# Patient Record
Sex: Female | Born: 1980 | Hispanic: No | Marital: Married | Smoking: Former smoker
Health system: Southern US, Community
[De-identification: ages and names within clinical notes are randomized; demographics above are authoritative.]

## PROBLEM LIST (undated history)

## (undated) DIAGNOSIS — O24419 Gestational diabetes mellitus in pregnancy, unspecified control: Secondary | ICD-10-CM

## (undated) HISTORY — DX: Gestational diabetes mellitus in pregnancy, unspecified control: O24.419

---

## 2001-07-09 ENCOUNTER — Other Ambulatory Visit: Admission: RE | Admit: 2001-07-09 | Discharge: 2001-07-09 | Payer: Self-pay | Admitting: Obstetrics and Gynecology

## 2003-09-23 ENCOUNTER — Other Ambulatory Visit: Admission: RE | Admit: 2003-09-23 | Discharge: 2003-09-23 | Payer: Self-pay | Admitting: Gynecology

## 2005-01-16 ENCOUNTER — Other Ambulatory Visit: Admission: RE | Admit: 2005-01-16 | Discharge: 2005-01-16 | Payer: Self-pay | Admitting: Obstetrics and Gynecology

## 2005-05-15 ENCOUNTER — Inpatient Hospital Stay (HOSPITAL_COMMUNITY): Admission: AD | Admit: 2005-05-15 | Discharge: 2005-05-15 | Payer: Self-pay | Admitting: Obstetrics and Gynecology

## 2005-05-16 ENCOUNTER — Inpatient Hospital Stay (HOSPITAL_COMMUNITY): Admission: AD | Admit: 2005-05-16 | Discharge: 2005-05-16 | Payer: Self-pay | Admitting: Obstetrics and Gynecology

## 2005-05-30 ENCOUNTER — Encounter: Admission: RE | Admit: 2005-05-30 | Discharge: 2005-05-30 | Payer: Self-pay | Admitting: Obstetrics and Gynecology

## 2005-07-25 ENCOUNTER — Inpatient Hospital Stay (HOSPITAL_COMMUNITY): Admission: AD | Admit: 2005-07-25 | Discharge: 2005-07-27 | Payer: Self-pay | Admitting: Obstetrics and Gynecology

## 2008-08-03 ENCOUNTER — Inpatient Hospital Stay (HOSPITAL_COMMUNITY): Admission: AD | Admit: 2008-08-03 | Discharge: 2008-08-04 | Payer: Self-pay | Admitting: Obstetrics and Gynecology

## 2011-04-07 NOTE — Discharge Summary (Signed)
NAMEPRISCILLIA, Kathleen Walton               ACCOUNT NO.:  [id number]   MEDICAL RECORD NO.:  [id number]          PATIENT TYPE:  INP   LOCATION:  9112                          FACILITY:  [facility]   PHYSICIAN:  Zenaida Niece, M.D.DATE OF BIRTH:  07/08/1981   DATE OF ADMISSION:  07/25/2005  DATE OF DISCHARGE:  07/27/2005                                 DISCHARGE SUMMARY   ADMISSION DIAGNOSES:  Intra-uterine pregnancy at 38 weeks and class A1  gestational diabetes.   DISCHARGE DIAGNOSES:  Intra-uterine pregnancy at 38 weeks and class A1  gestational diabetes.   PROCEDURES:  On July 25, 2005, she had a spontaneous vaginal delivery.   HISTORY AND PHYSICAL:  This is a 30 year old white female gravida 2, para 0-  0-1-0 with an EGA of 38+ weeks who presents complaining of regular  contractions. The cervix on initial evaluation revealed her to be 9 cm  dilated with an intact bag of water. Prenatal care complicated by class A1  gestational diabetes with good control.   PRENATAL LABORATORIES:  A blood type is A+ with a negative antibody screen.  RPR nonreactive, rubella immune, hepatitis B surface antigen negative, HIV  negative, gonorrhea and chlamydia negative, one hour Glucola 162, three hour  GTT 91, 211, 220 and 168 and group B strep is negative.   PAST OBSTETRIC HISTORY:  Significant for one elective termination.   PHYSICAL EXAMINATION:  VITAL SIGNS:  She is afebrile with stable vital  signs. Fetal heart tracing reactive with contractions every two to three  minutes.  ABDOMEN:  Gravid, nontender with an estimated fetal weight of 8 pounds.  Cervix on my first exam, she was complete, complete and +3 with a vertex  presentation and an adequate pelvis and had just had spontaneous rupture of  membranes with clear fluid.   HOSPITAL COURSE:  The patient was admitted in advanced labor. She progressed  to complete, pushed well and on the evening of September5, 2006, had a  vaginal delivery of a  viable female infant with Apgars of 9 and 9, that  weighed 8 pounds 11 ounces. Placenta delivered spontaneously and was intact.  She had a second-degree laceration repaired with 3-0 Vicryl with local  block. Estimated blood loss was less than 500 cc. She was given IM Pitocin  and did not receive an IV due to her rapid progress. Postpartum, she had no  significant complications with a hemoglobin of 10.8 and on postpartum #2,  was stable for discharge home.   DISCHARGE INSTRUCTIONS:  Regular diet, pelvic rest, follow-up in six weeks.  Medications are over-the-counter ibuprofen as needed. She is given our  discharge pamphlet.      Zenaida Niece, M.D.  Electronically Signed    TDM/MEDQ  D:  07/27/2005  T:  07/27/2005  Job:  [id number]

## 2011-08-21 LAB — CBC
HCT: 34.5 — ABNORMAL LOW
MCV: 85
Platelets: 115 — ABNORMAL LOW
RBC: 4.06
WBC: 12 — ABNORMAL HIGH

## 2011-08-23 LAB — CBC
HCT: 38.1
Hemoglobin: 12.7
MCV: 86.2
Platelets: 130 — ABNORMAL LOW
WBC: 11.6 — ABNORMAL HIGH

## 2012-02-11 ENCOUNTER — Ambulatory Visit (INDEPENDENT_AMBULATORY_CARE_PROVIDER_SITE_OTHER): Payer: 59 | Admitting: Physician Assistant

## 2012-02-11 VITALS — BP 102/67 | HR 62 | Temp 98.5°F | Resp 16 | Ht 61.0 in | Wt 131.0 lb

## 2012-02-11 DIAGNOSIS — R509 Fever, unspecified: Secondary | ICD-10-CM

## 2012-02-11 DIAGNOSIS — R059 Cough, unspecified: Secondary | ICD-10-CM

## 2012-02-11 DIAGNOSIS — B9789 Other viral agents as the cause of diseases classified elsewhere: Secondary | ICD-10-CM

## 2012-02-11 DIAGNOSIS — R05 Cough: Secondary | ICD-10-CM

## 2012-02-11 MED ORDER — IPRATROPIUM BROMIDE 0.06 % NA SOLN: 2.0000 | Freq: Four times a day (QID) | NASAL | Status: AC

## 2012-02-11 MED ORDER — PROMETHAZINE-DM 6.25-15 MG/5ML PO SYRP: 5.0000 mL | ORAL_SOLUTION | Freq: Every day | ORAL | Status: AC

## 2012-02-11 NOTE — Progress Notes (Signed)
  Subjective:    Patient ID: Kathleen Walton, female    DOB: 06-23-1981, 31 y.o.   MRN: [id number]  HPI  Kathleen Walton is here today with > 2 days of fever/chills (T max 102), myalgias, dry cough. Some nasal congestion.  No SOB or wheezing. No GI symptoms. No influenza vaccine this season.  Smoker   Review of Systems As above    Objective:   Physical Exam  Constitutional: Vital signs are normal.  HENT:  Right Ear: Tympanic membrane normal.  Left Ear: Tympanic membrane normal.  Nose: Mucosal edema present.  Mouth/Throat: Uvula is midline, oropharynx is clear and moist and mucous membranes are normal.  Cardiovascular: Normal rate and regular rhythm.   Pulmonary/Chest: Effort normal and breath sounds normal.  Lymphadenopathy:    She has no cervical adenopathy.  Skin: Skin is warm and dry.          Assessment & Plan:  Fever  Cough, likely influenza but > 48 hours from onset of symptoms  Phenergan DM, Mucinex DM for daytime cough, Atrovent NS.  Tylenol every 4 hours. Watch for worsening symptoms.

## 2013-11-20 NOTE — L&D Delivery Note (Signed)
Delivery Note Pt progressed rapidly to complete and pushed once.  I was 40 feet away and the baby was in the bed by the time I got to the room.  At 10:26 PM a viable female was delivered via Vaginal, Spontaneous Delivery (Presentation: Left Occiput Anterior).  APGAR: 9, 9; weight  pending.   Placenta status: Intact, Spontaneous.  Cord: 3 vessels with the following complications: None.   Anesthesia: Local  Episiotomy: None Lacerations: 1st degree, right labial Suture Repair: 3.0 vicryl rapide Est. Blood Loss (mL): 300  Mom to postpartum.  Baby to Couplet care / Skin to Skin.  Shantea Poulton D 11/03/2014, 10:53 PM

## 2014-05-28 LAB — OB RESULTS CONSOLE GC/CHLAMYDIA
Chlamydia: NEGATIVE
Gonorrhea: NEGATIVE

## 2014-05-28 LAB — OB RESULTS CONSOLE RPR: RPR: NONREACTIVE

## 2014-05-28 LAB — OB RESULTS CONSOLE HIV ANTIBODY (ROUTINE TESTING): HIV: NONREACTIVE

## 2014-05-28 LAB — OB RESULTS CONSOLE ABO/RH: RH Type: POSITIVE

## 2014-05-28 LAB — OB RESULTS CONSOLE ANTIBODY SCREEN: Antibody Screen: NEGATIVE

## 2014-05-28 LAB — OB RESULTS CONSOLE HEPATITIS B SURFACE ANTIGEN: Hepatitis B Surface Ag: NEGATIVE

## 2014-05-28 LAB — OB RESULTS CONSOLE RUBELLA ANTIBODY, IGM: RUBELLA: IMMUNE

## 2014-08-26 ENCOUNTER — Encounter: Payer: BC Managed Care – PPO | Attending: Obstetrics and Gynecology

## 2014-08-26 VITALS — Ht 63.0 in | Wt 160.2 lb

## 2014-08-26 DIAGNOSIS — Z713 Dietary counseling and surveillance: Secondary | ICD-10-CM | POA: Diagnosis not present

## 2014-08-26 DIAGNOSIS — O24419 Gestational diabetes mellitus in pregnancy, unspecified control: Secondary | ICD-10-CM | POA: Diagnosis not present

## 2014-09-02 ENCOUNTER — Ambulatory Visit: Payer: BC Managed Care – PPO

## 2014-09-02 NOTE — Progress Notes (Signed)
  Patient was seen on 08/26/14 for Gestational Diabetes self-management . The following learning objectives were met by the patient :   States the definition of Gestational Diabetes  States why dietary management is important in controlling blood glucose  Describes the effects of carbohydrates on blood glucose levels  Demonstrates ability to create a balanced meal plan  Demonstrates carbohydrate counting   States when to check blood glucose levels  Demonstrates proper blood glucose monitoring techniques  States the effect of stress and exercise on blood glucose levels  States the importance of limiting caffeine and abstaining from alcohol and smoking  Plan:  Aim for 2 Carb Choices per meal (30 grams) +/- 1 either way for breakfast Aim for 3 Carb Choices per meal (45 grams) +/- 1 either way from lunch and dinner Aim for 1-2 Carbs per snack Begin reading food labels for Total Carbohydrate and sugar grams of foods Consider  increasing your activity level by walking daily as tolerated Begin checking BG before breakfast and 1 hours after first bit of breakfast, lunch and dinner after  as directed by MD  Take medication  as directed by MD  Blood glucose monitor given: One Touch Ultra 2 Lot # [id number]  Exp: 03/2015  Blood glucose reading: 180m/dl  Patient instructed to monitor glucose levels: FBS: 60 - <90 1 hour: <140  Patient received the following handouts:  Nutrition Diabetes and Pregnancy  Carbohydrate Counting List  Meal Planning worksheet  Patient will be seen for follow-up as needed.

## 2014-10-22 LAB — OB RESULTS CONSOLE GBS: GBS: POSITIVE

## 2014-11-03 ENCOUNTER — Encounter (HOSPITAL_COMMUNITY): Payer: Self-pay | Admitting: *Deleted

## 2014-11-03 ENCOUNTER — Inpatient Hospital Stay (HOSPITAL_COMMUNITY)
Admission: AD | Admit: 2014-11-03 | Discharge: 2014-11-05 | DRG: 775 | Disposition: A | Discharge status: Home / Self Care | Payer: BC Managed Care – PPO | Source: Ambulatory Visit | Attending: Obstetrics and Gynecology | Admitting: Obstetrics and Gynecology

## 2014-11-03 DIAGNOSIS — O24420 Gestational diabetes mellitus in childbirth, diet controlled: Secondary | ICD-10-CM | POA: Diagnosis present

## 2014-11-03 DIAGNOSIS — O99824 Streptococcus B carrier state complicating childbirth: Secondary | ICD-10-CM | POA: Diagnosis present

## 2014-11-03 DIAGNOSIS — Z87891 Personal history of nicotine dependence: Secondary | ICD-10-CM

## 2014-11-03 DIAGNOSIS — O24410 Gestational diabetes mellitus in pregnancy, diet controlled: Secondary | ICD-10-CM | POA: Diagnosis present

## 2014-11-03 DIAGNOSIS — Z8249 Family history of ischemic heart disease and other diseases of the circulatory system: Secondary | ICD-10-CM

## 2014-11-03 DIAGNOSIS — Z833 Family history of diabetes mellitus: Secondary | ICD-10-CM

## 2014-11-03 DIAGNOSIS — IMO0001 Reserved for inherently not codable concepts without codable children: Secondary | ICD-10-CM

## 2014-11-03 DIAGNOSIS — Z3A37 37 weeks gestation of pregnancy: Secondary | ICD-10-CM | POA: Diagnosis present

## 2014-11-03 LAB — CBC
HCT: 35.4 % — ABNORMAL LOW (ref 36.0–46.0)
HEMOGLOBIN: 11.9 g/dL — AB (ref 12.0–15.0)
MCH: 28.1 pg (ref 26.0–34.0)
MCHC: 33.6 g/dL (ref 30.0–36.0)
MCV: 83.7 fL (ref 78.0–100.0)
Platelets: 139 10*3/uL — ABNORMAL LOW (ref 150–400)
RBC: 4.23 MIL/uL (ref 3.87–5.11)
RDW: 16.5 % — AB (ref 11.5–15.5)
WBC: 13.6 10*3/uL — ABNORMAL HIGH (ref 4.0–10.5)

## 2014-11-03 LAB — TYPE AND SCREEN
ABO/RH(D): A POS
ANTIBODY SCREEN: NEGATIVE

## 2014-11-03 MED ORDER — PHENYLEPHRINE 40 MCG/ML (10ML) SYRINGE FOR IV PUSH (FOR BLOOD PRESSURE SUPPORT)
80.0000 ug | PREFILLED_SYRINGE | INTRAVENOUS | Status: DC | PRN
  Filled 2014-11-03: qty 2

## 2014-11-03 MED ORDER — LIDOCAINE HCL (PF) 1 % IJ SOLN
30.0000 mL | INTRAMUSCULAR | Status: DC | PRN
  Administered 2014-11-03: 30 mL via SUBCUTANEOUS
  Filled 2014-11-03: qty 30

## 2014-11-03 MED ORDER — OXYCODONE-ACETAMINOPHEN 5-325 MG PO TABS: 1.0000 | ORAL_TABLET | ORAL | Status: DC | PRN

## 2014-11-03 MED ORDER — CITRIC ACID-SODIUM CITRATE 334-500 MG/5ML PO SOLN: 30.0000 mL | ORAL | Status: DC | PRN

## 2014-11-03 MED ORDER — BUTORPHANOL TARTRATE 1 MG/ML IJ SOLN: 1.0000 mg | INTRAMUSCULAR | Status: DC | PRN

## 2014-11-03 MED ORDER — ONDANSETRON HCL 4 MG/2ML IJ SOLN: 4.0000 mg | Freq: Four times a day (QID) | INTRAMUSCULAR | Status: DC | PRN

## 2014-11-03 MED ORDER — FENTANYL 2.5 MCG/ML BUPIVACAINE 1/10 % EPIDURAL INFUSION (WH - ANES): 14.0000 mL/h | INTRAMUSCULAR | Status: DC | PRN

## 2014-11-03 MED ORDER — LACTATED RINGERS IV SOLN
INTRAVENOUS | Status: DC
Start: 2014-11-03 — End: 2014-11-04
  Administered 2014-11-03: 22:00:00 via INTRAVENOUS

## 2014-11-03 MED ORDER — OXYTOCIN 40 UNITS IN LACTATED RINGERS INFUSION - SIMPLE MED
62.5000 mL/h | INTRAVENOUS | Status: DC
Start: 2014-11-03 — End: 2014-11-04
  Filled 2014-11-03: qty 1000

## 2014-11-03 MED ORDER — PENICILLIN G POTASSIUM 5000000 UNITS IJ SOLR
2.5000 10*6.[IU] | INTRAVENOUS | Status: DC
  Filled 2014-11-03 (×5): qty 2.5

## 2014-11-03 MED ORDER — PENICILLIN G POTASSIUM 5000000 UNITS IJ SOLR
5.0000 10*6.[IU] | Freq: Once | INTRAVENOUS | Status: AC
  Administered 2014-11-03: 5 10*6.[IU] via INTRAVENOUS
  Filled 2014-11-03: qty 5

## 2014-11-03 MED ORDER — EPHEDRINE 5 MG/ML INJ
10.0000 mg | INTRAVENOUS | Status: DC | PRN
  Filled 2014-11-03: qty 2

## 2014-11-03 MED ORDER — DIPHENHYDRAMINE HCL 50 MG/ML IJ SOLN: 12.5000 mg | INTRAMUSCULAR | Status: DC | PRN

## 2014-11-03 MED ORDER — OXYCODONE-ACETAMINOPHEN 5-325 MG PO TABS: 2.0000 | ORAL_TABLET | ORAL | Status: DC | PRN

## 2014-11-03 MED ORDER — LACTATED RINGERS IV SOLN: 500.0000 mL | Freq: Once | INTRAVENOUS | Status: DC

## 2014-11-03 MED ORDER — OXYTOCIN BOLUS FROM INFUSION
500.0000 mL | INTRAVENOUS | Status: DC
Start: 2014-11-03 — End: 2014-11-04
  Administered 2014-11-03: 500 mL via INTRAVENOUS

## 2014-11-03 MED ORDER — IBUPROFEN 600 MG PO TABS
600.0000 mg | ORAL_TABLET | Freq: Four times a day (QID) | ORAL | Status: DC
  Administered 2014-11-03 – 2014-11-05 (×7): 600 mg via ORAL
  Filled 2014-11-03 (×8): qty 1

## 2014-11-03 MED ORDER — LACTATED RINGERS IV SOLN: 500.0000 mL | INTRAVENOUS | Status: DC | PRN

## 2014-11-03 NOTE — H&P (Signed)
Kathleen Walton is a 33 y.o. female, G3P2002, EGA 37+ weeks with EDC 12-31 presenting for eval of ctx.  In MAU, uncomfortable with ctx, VE 5 cm.  Prenatal care complicated by A1GDM well controlled, see prenatal records for complete history.  Maternal Medical History:  Reason for admission: Contractions.   Contractions: Frequency: regular.   Perceived severity is strong.    Fetal activity: Perceived fetal activity is normal.    Prenatal Complications - Diabetes: gestational. Diabetes is managed by diet.      OB History    Gravida Para Term Preterm AB TAB SAB Ectopic Multiple Living   3 2        2     SVD x 2  Past Medical History  Diagnosis Date  . Gestational diabetes mellitus, antepartum    History reviewed. No pertinent past surgical history. Family History: family history includes Diabetes in her father and other; Hypertension in her mother. Social History:  reports that she has quit smoking. Her smoking use included Cigarettes. She has a 5 pack-year smoking history. She quit smokeless tobacco use about 9 months ago. Her alcohol and drug histories are not on file.   Prenatal Transfer Tool  Maternal Diabetes: Yes:  Diabetes Type:  Diet controlled Genetic Screening: Declined Maternal Ultrasounds/Referrals: Normal Fetal Ultrasounds or other Referrals:  None Maternal Substance Abuse:  No Significant Maternal Medications:  None Significant Maternal Lab Results:  Lab values include: Group B Strep positive Other Comments:  None  Review of Systems  Respiratory: Negative.   Cardiovascular: Negative.     Dilation: 7 Effacement (%): 90 Station: 0 Exam by:: Dr. Jackelyn KnifeMeisinger Blood pressure 134/83, pulse 85, resp. rate 18, height 5\' 4"  (1.626 m), weight 72.576 kg (160 lb). Maternal Exam:  Uterine Assessment: Contraction strength is moderate.  Contraction frequency is regular.   Abdomen: Patient reports no abdominal tenderness. Estimated fetal weight is 7 1/2 lbs.   Fetal  presentation: vertex  Introitus: Normal vulva. Normal vagina.  Amniotic fluid character: clear.  Pelvis: adequate for delivery.   Cervix: Cervix evaluated by digital exam.     Fetal Exam Fetal Monitor Review: Mode: ultrasound.   Variability: moderate (6-25 bpm).   Pattern: accelerations present and no decelerations.    Fetal State Assessment: Category I - tracings are normal.     Physical Exam  Vitals reviewed. Constitutional: She appears well-developed and well-nourished.  Cardiovascular: Normal rate, regular rhythm and normal heart sounds.   No murmur heard. Respiratory: Effort normal and breath sounds normal. No respiratory distress. She has no wheezes.  GI: Soft.    Prenatal labs: ABO, Rh: A/Positive/-- (07/09 0000) Antibody: Negative (07/09 0000) Rubella: Immune (07/09 0000) RPR: Nonreactive (07/09 0000)  HBsAg: Negative (07/09 0000)  HIV: Non-reactive (07/09 0000)  GBS:   positive  Assessment/Plan: IUP at 37+ weeks in active labor, +GBS.  On PCN, monitor progress, anticipate SVD.   Kathleen Walton D 11/03/2014, 9:55 PM

## 2014-11-03 NOTE — MAU Note (Signed)
Pt states she had mucous discharge this morning and contractions were about 

## 2014-11-04 LAB — ABO/RH: ABO/RH(D): A POS

## 2014-11-04 LAB — GLUCOSE, CAPILLARY: Glucose-Capillary: 105 mg/dL — ABNORMAL HIGH (ref 70–99)

## 2014-11-04 LAB — RPR

## 2014-11-04 MED ORDER — SIMETHICONE 80 MG PO CHEW: 80.0000 mg | CHEWABLE_TABLET | ORAL | Status: DC | PRN

## 2014-11-04 MED ORDER — MEASLES, MUMPS & RUBELLA VAC ~~LOC~~ INJ
0.5000 mL | INJECTION | Freq: Once | SUBCUTANEOUS | Status: DC
  Filled 2014-11-04: qty 0.5

## 2014-11-04 MED ORDER — ONDANSETRON HCL 4 MG/2ML IJ SOLN: 4.0000 mg | INTRAMUSCULAR | Status: DC | PRN

## 2014-11-04 MED ORDER — LANOLIN HYDROUS EX OINT: TOPICAL_OINTMENT | CUTANEOUS | Status: DC | PRN

## 2014-11-04 MED ORDER — METHYLERGONOVINE MALEATE 0.2 MG PO TABS: 0.2000 mg | ORAL_TABLET | ORAL | Status: DC | PRN

## 2014-11-04 MED ORDER — WITCH HAZEL-GLYCERIN EX PADS: 1.0000 "application " | MEDICATED_PAD | CUTANEOUS | Status: DC | PRN

## 2014-11-04 MED ORDER — SENNOSIDES-DOCUSATE SODIUM 8.6-50 MG PO TABS
2.0000 | ORAL_TABLET | ORAL | Status: DC
  Filled 2014-11-04: qty 2

## 2014-11-04 MED ORDER — METHYLERGONOVINE MALEATE 0.2 MG/ML IJ SOLN: 0.2000 mg | INTRAMUSCULAR | Status: DC | PRN

## 2014-11-04 MED ORDER — DIBUCAINE 1 % RE OINT: 1.0000 "application " | TOPICAL_OINTMENT | RECTAL | Status: DC | PRN

## 2014-11-04 MED ORDER — MAGNESIUM HYDROXIDE 400 MG/5ML PO SUSP: 30.0000 mL | ORAL | Status: DC | PRN

## 2014-11-04 MED ORDER — OXYCODONE-ACETAMINOPHEN 5-325 MG PO TABS: 1.0000 | ORAL_TABLET | ORAL | Status: DC | PRN

## 2014-11-04 MED ORDER — DIPHENHYDRAMINE HCL 25 MG PO CAPS: 25.0000 mg | ORAL_CAPSULE | Freq: Four times a day (QID) | ORAL | Status: DC | PRN

## 2014-11-04 MED ORDER — ONDANSETRON HCL 4 MG PO TABS: 4.0000 mg | ORAL_TABLET | ORAL | Status: DC | PRN

## 2014-11-04 MED ORDER — ZOLPIDEM TARTRATE 5 MG PO TABS: 5.0000 mg | ORAL_TABLET | Freq: Every evening | ORAL | Status: DC | PRN

## 2014-11-04 MED ORDER — BENZOCAINE-MENTHOL 20-0.5 % EX AERO: 1.0000 "application " | INHALATION_SPRAY | CUTANEOUS | Status: DC | PRN

## 2014-11-04 MED ORDER — TETANUS-DIPHTH-ACELL PERTUSSIS 5-2.5-18.5 LF-MCG/0.5 IM SUSP: 0.5000 mL | Freq: Once | INTRAMUSCULAR | Status: DC

## 2014-11-04 MED ORDER — OXYCODONE-ACETAMINOPHEN 5-325 MG PO TABS
2.0000 | ORAL_TABLET | ORAL | Status: DC | PRN
Start: 2014-11-04 — End: 2014-11-05
  Administered 2014-11-04: 2 via ORAL
  Filled 2014-11-04: qty 2

## 2014-11-04 MED ORDER — PRENATAL MULTIVITAMIN CH
1.0000 | ORAL_TABLET | Freq: Every day | ORAL | Status: DC
  Administered 2014-11-04: 1 via ORAL
  Filled 2014-11-04: qty 1

## 2014-11-04 NOTE — Lactation Note (Signed)
This note was copied from the chart of Kathleen Walton. Lactation Consultation Note  Initial visit made.  Breastfeeding consultation services and support information given to patient.  Baby is currently in cross cradle hold and latched to right breast.  Latch is deep and baby is actively nursing with audible swallows.  Instructed to feed with any feeding cue and use breast massage on and off to keep baby interested. Mom breastfed her last baby for 17 months.  Encouraged to call with concerns or latch assist prn.  Patient Name: Kathleen Lovena LeDanijela Gropp Today's Date: 11/04/2014 Reason for consult: Initial assessment   Maternal Data    Feeding Feeding Type: Breast Fed  LATCH Score/Interventions Latch: Grasps breast easily, tongue down, lips flanged, rhythmical sucking.  Audible Swallowing: A few with stimulation Intervention(s): Alternate breast massage  Type of Nipple: Everted at rest and after stimulation  Comfort (Breast/Nipple): Soft / non-tender     Hold (Positioning): No assistance needed to correctly position infant at breast.  LATCH Score: 9  Lactation Tools Discussed/Used     Consult Status Consult Status: Follow-up Date: 11/05/14 Follow-up type: In-patient    Huston FoleyMOULDEN, Courtland Reas S 11/04/2014, 2:35 PM

## 2014-11-04 NOTE — Progress Notes (Signed)
PPD #1 No problems Afeb, VSS Fundus firm, NT at U-1 Continue routine postpartum care 

## 2014-11-05 MED ORDER — IBUPROFEN 600 MG PO TABS: 600.0000 mg | ORAL_TABLET | Freq: Four times a day (QID) | ORAL | Status: AC

## 2014-11-05 NOTE — Discharge Summary (Signed)
Obstetric Discharge Summary Reason for Admission: onset of labor Prenatal Procedures: none Intrapartum Procedures: spontaneous vaginal delivery and GBS prophylaxis Postpartum Procedures: none Complications-Operative and Postpartum: 1st degree perineal laceration HEMOGLOBIN  Date Value Ref Range Status  11/03/2014 11.9* 12.0 - 15.0 g/dL Final   HCT  Date Value Ref Range Status  11/03/2014 35.4* 36.0 - 46.0 % Final    Physical Exam:  General: alert Lochia: appropriate Uterine Fundus: firm   Discharge Diagnoses: Term Pregnancy-delivered and A1GDM  Discharge Information: Date: 11/05/2014 Activity: pelvic rest Diet: routine Medications: Ibuprofen Condition: stable Instructions: refer to practice specific booklet Discharge to: home Follow-up Information    Follow up with Bellamy Rubey D, MD. Schedule an appointment as soon as possible for a visit in 6 weeks.   Specialty:  Obstetrics and Gynecology   Contact information:   [address], [address] [place] [address] [phone]       Newborn Data: Live born female  Birth Weight: 6 lb 9.3 oz (2985 g) APGAR: 9, 9  Home with mother.  Haylea Schlichting D 11/05/2014, 8:25 AM

## 2014-11-05 NOTE — Progress Notes (Signed)
PPD #2 Doing well Afeb, VSS D/c home 

## 2014-11-05 NOTE — Lactation Note (Signed)
This note was copied from the chart of Kathleen Walton. Lactation Consultation Note: Follow up visit with this experienced BF mom before DC. She reports that baby has been nursing great with no pain. Last fed about 1 hour ago. No questions at present. To call prn  Patient Name: Kathleen Walton NWGNF'AToday's Date: 11/05/2014 Reason for consult: Follow-up assessment   Maternal Data Formula Feeding for Exclusion: No Has patient been taught Hand Expression?: Yes Does the patient have breastfeeding experience prior to this delivery?: Yes  Feeding Feeding Type: Breast Fed  LATCH Score/Interventions Latch: Grasps breast easily, tongue down, lips flanged, rhythmical sucking.  Audible Swallowing: A few with stimulation  Type of Nipple: Everted at rest and after stimulation  Comfort (Breast/Nipple): Soft / non-tender     Hold (Positioning): Assistance needed to correctly position infant at breast and maintain latch.  LATCH Score: 8  Lactation Tools Discussed/Used     Consult Status Consult Status: Complete    [name], [name] D 11/05/2014, 7:53 AM

## 2014-11-05 NOTE — Discharge Instructions (Signed)
As per discharge pamphlet °

## 2022-01-12 ENCOUNTER — Other Ambulatory Visit: Payer: Self-pay | Admitting: Obstetrics and Gynecology

## 2022-01-12 DIAGNOSIS — R928 Other abnormal and inconclusive findings on diagnostic imaging of breast: Secondary | ICD-10-CM

## 2022-02-17 ENCOUNTER — Ambulatory Visit
Admission: RE | Admit: 2022-02-17 | Discharge: 2022-02-17 | Disposition: A | Discharge status: Home / Self Care | Payer: BC Managed Care – PPO | Source: Ambulatory Visit | Attending: Obstetrics and Gynecology | Admitting: Obstetrics and Gynecology

## 2022-02-17 ENCOUNTER — Ambulatory Visit
Admission: RE | Admit: 2022-02-17 | Discharge: 2022-02-17 | Disposition: A | Discharge status: Home / Self Care | Payer: Self-pay | Source: Ambulatory Visit | Attending: Obstetrics and Gynecology | Admitting: Obstetrics and Gynecology

## 2022-02-17 DIAGNOSIS — R928 Other abnormal and inconclusive findings on diagnostic imaging of breast: Secondary | ICD-10-CM

## 2023-04-06 IMAGING — US US BREAST*L* LIMITED INC AXILLA
1 series · 3 of 3 positions shown · non-contrast
Comparison: Previous exam(s).

CLINICAL DATA: 40-year-old female for further evaluation of
possible bilateral breast asymmetries identified on screening
mammogram.



[Series 1: us breast*left* limited inc axilla · 0.07mm/px · 3 of 3 slices shown]
[im 1/3]
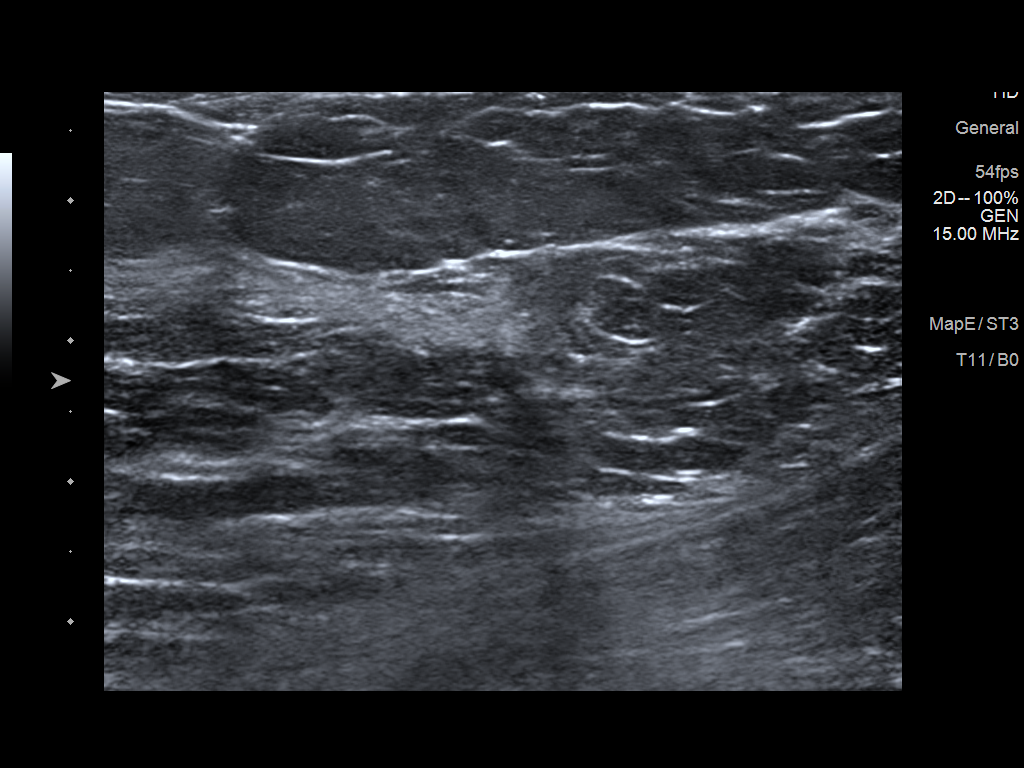
[im 2/3]
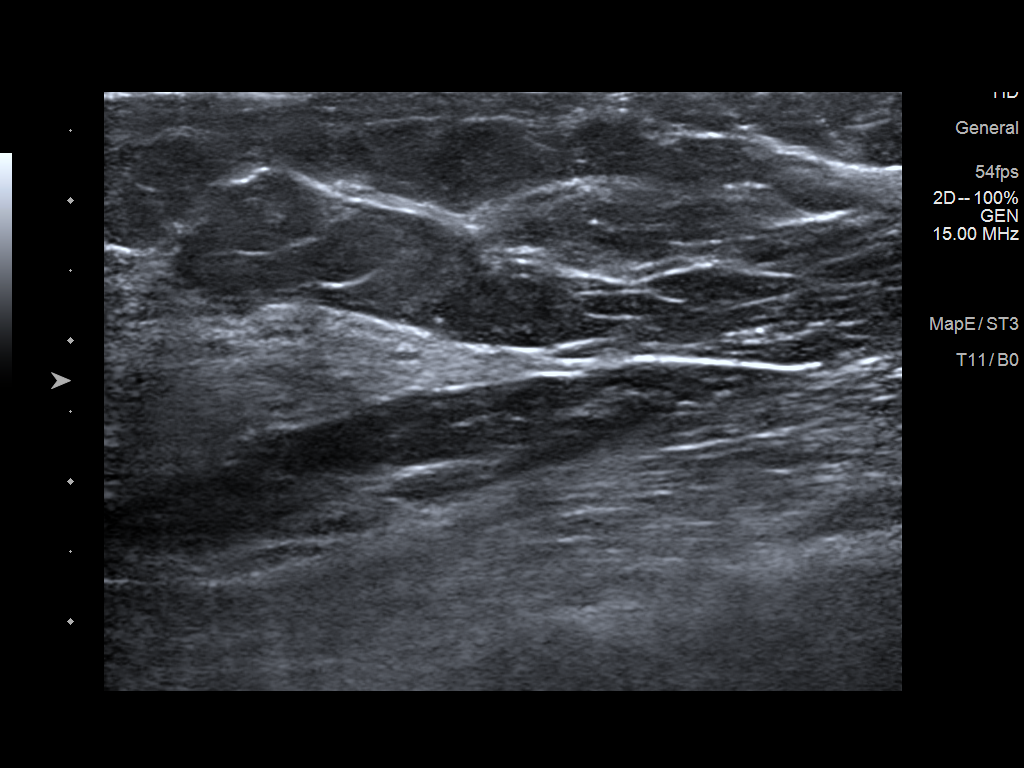
[im 3/3]
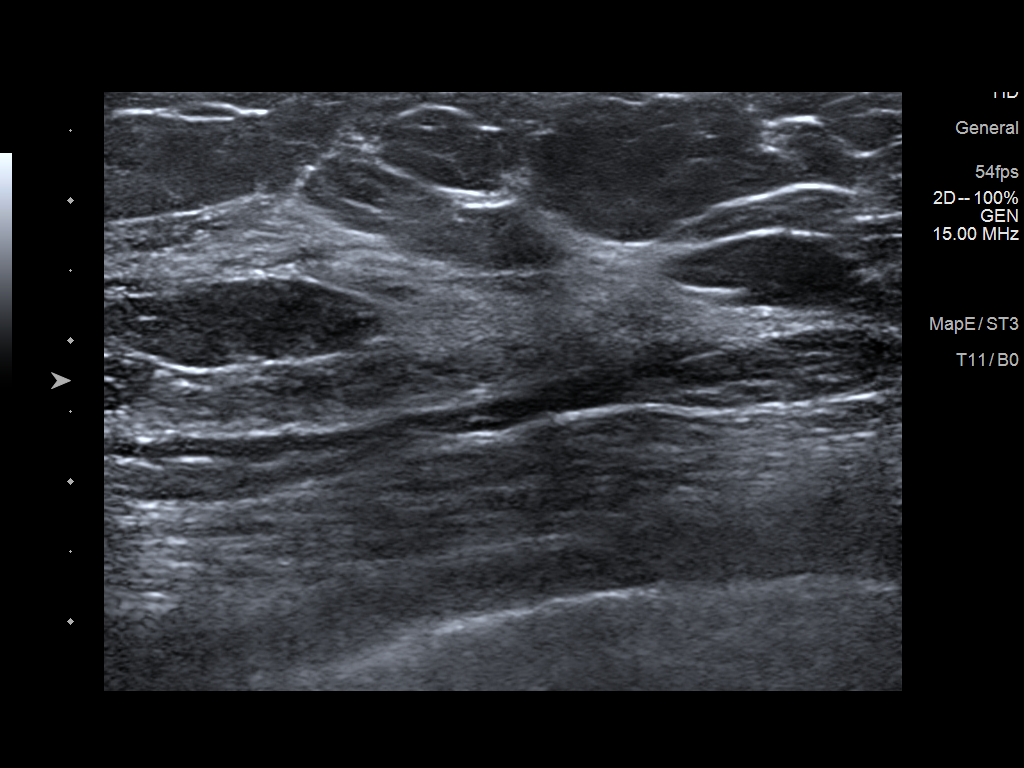

[3 of 3 positions shown; findings below may reference images not displayed]

ACR Breast Density Category c: The breast tissue is heterogeneously
dense, which may obscure small masses.
FINDINGS: 2D/3D full field and spot compression views of both breasts
demonstrate effacement of the screening study asymmetries without
persistent suspicious abnormalities in these areas. However
heterogeneously dense breast tissue in these areas are noted.

Targeted ultrasound is performed, showing no sonographic
abnormalities within either breast, in the areas of the screening
study findings.
IMPRESSION: 1. No persistent mammographic or sonographic abnormalities in the
areas of the screening study findings, compatible with normal
fibroglandular tissue.

RECOMMENDATION:
Bilateral screening mammogram in 1 year.

I have discussed the findings and recommendations with the patient.
If applicable, a reminder letter will be sent to the patient
regarding the next appointment.

BI-RADS CATEGORY  1: Negative.

## 2023-04-06 IMAGING — US US BREAST*R* LIMITED INC AXILLA
1 series · 3 of 3 positions shown · non-contrast
Comparison: Previous exam(s).

CLINICAL DATA: 40-year-old female for further evaluation of
possible bilateral breast asymmetries identified on screening
mammogram.



[Series 1: us breast*right* limited inc axilla · 0.07mm/px · 3 of 3 slices shown]
[im 1/3]
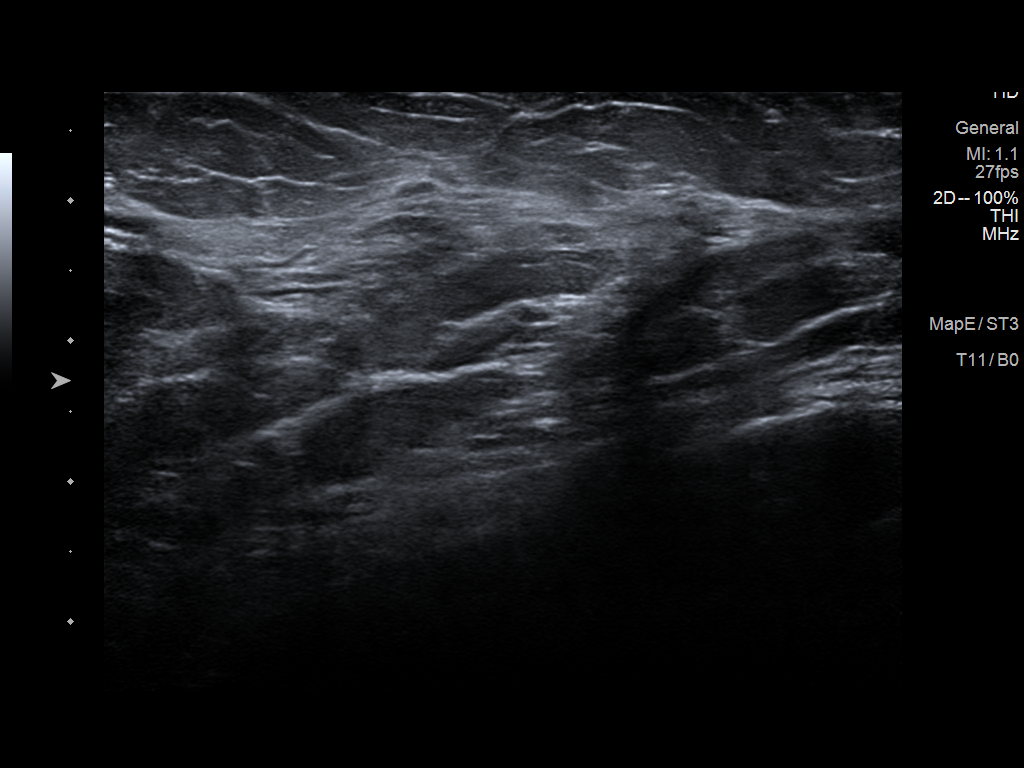
[im 2/3]
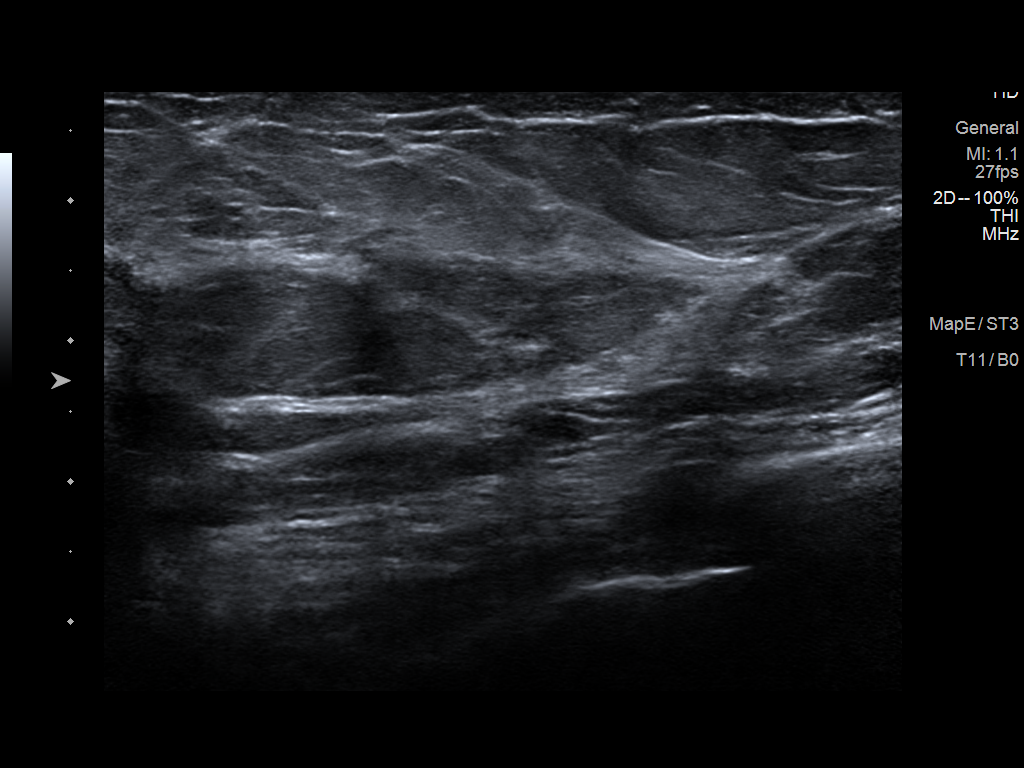
[im 3/3]
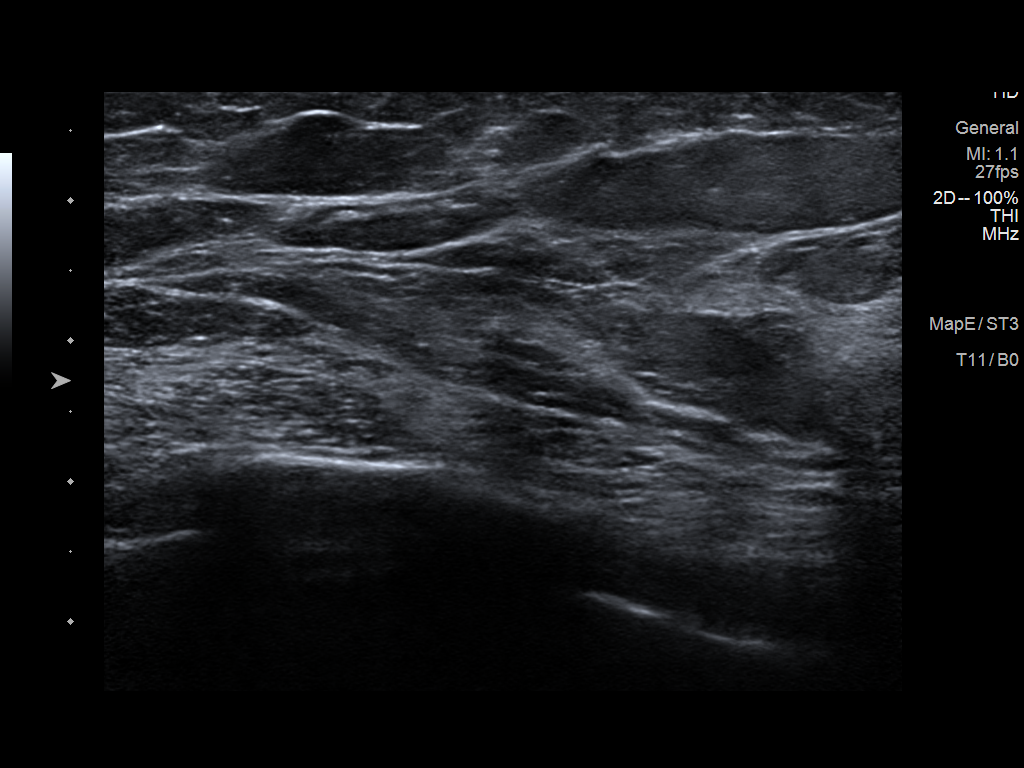

[3 of 3 positions shown; findings below may reference images not displayed]

ACR Breast Density Category c: The breast tissue is heterogeneously
dense, which may obscure small masses.
FINDINGS: 2D/3D full field and spot compression views of both breasts
demonstrate effacement of the screening study asymmetries without
persistent suspicious abnormalities in these areas. However
heterogeneously dense breast tissue in these areas are noted.

Targeted ultrasound is performed, showing no sonographic
abnormalities within either breast, in the areas of the screening
study findings.
IMPRESSION: 1. No persistent mammographic or sonographic abnormalities in the
areas of the screening study findings, compatible with normal
fibroglandular tissue.

RECOMMENDATION:
Bilateral screening mammogram in 1 year.

I have discussed the findings and recommendations with the patient.
If applicable, a reminder letter will be sent to the patient
regarding the next appointment.

BI-RADS CATEGORY  1: Negative.
